# Patient Record
Sex: Female | Born: 1980 | Race: Black or African American | Hispanic: No | Marital: Married | Smoking: Never smoker
Health system: Southern US, Community
[De-identification: ages and names within clinical notes are randomized; demographics above are authoritative.]

---

## 2007-05-19 ENCOUNTER — Other Ambulatory Visit: Admission: RE | Admit: 2007-05-19 | Discharge: 2007-05-19 | Payer: Self-pay | Admitting: Obstetrics and Gynecology

## 2007-05-19 ENCOUNTER — Ambulatory Visit (HOSPITAL_COMMUNITY): Admission: RE | Admit: 2007-05-19 | Discharge: 2007-05-19 | Payer: Self-pay | Admitting: Family Medicine

## 2014-01-26 ENCOUNTER — Encounter: Payer: Self-pay | Admitting: Family Medicine

## 2014-01-26 ENCOUNTER — Encounter (INDEPENDENT_AMBULATORY_CARE_PROVIDER_SITE_OTHER): Payer: Self-pay

## 2014-01-26 ENCOUNTER — Ambulatory Visit (INDEPENDENT_AMBULATORY_CARE_PROVIDER_SITE_OTHER): Payer: BC Managed Care – PPO | Admitting: Family Medicine

## 2014-01-26 VITALS — BP 104/63 | HR 67 | Temp 98.4°F | Ht 63.0 in | Wt 166.0 lb

## 2014-01-26 DIAGNOSIS — Z0000 Encounter for general adult medical examination without abnormal findings: Secondary | ICD-10-CM | POA: Insufficient documentation

## 2014-01-26 NOTE — Patient Instructions (Signed)

## 2014-01-26 NOTE — Progress Notes (Addendum)
   Subjective:    Patient ID: Lauren RichardsFelicia N Watts, female    DOB: 08/11/1980, 33 y.o.   MRN: [id number]  HPI 33 year old female who is here for a physical for foster care. She had similar exam 2 years ago at this office. She denies any problems or symptoms, today.    Review of Systems  Constitutional: Negative.   HENT: Negative.   Eyes: Negative.   Respiratory: Negative.   Cardiovascular: Negative.   Gastrointestinal: Negative.   Endocrine: Negative.   Genitourinary: Negative.   Musculoskeletal: Positive for myalgias.       Bilateral thumb pain  Skin: Rash: not true rash but healing intertrigo.  Hematological: Negative.   Psychiatric/Behavioral: Negative.        Objective:   Physical Exam  Constitutional: She is oriented to person, place, and time. She appears well-developed and well-nourished.  Eyes: Conjunctivae and EOM are normal.  Neck: Normal range of motion. Neck supple.  Cardiovascular: Normal rate, regular rhythm and normal heart sounds.   Pulmonary/Chest: Effort normal and breath sounds normal.  Abdominal: Soft. Bowel sounds are normal.  Musculoskeletal: Normal range of motion.  Neurological: She is alert and oriented to person, place, and time. She has normal reflexes.  Skin: Skin is warm and dry.  Psychiatric: She has a normal mood and affect. Her behavior is normal. Thought content normal.    BP 104/63  Pulse 67  Temp(Src) 98.4 F (36.9 C) (Oral)  Ht 5\' 3"  (1.6 m)  Wt 166 lb (75.297 kg)  BMI 29.41 kg/m2      Assessment & Plan:  1. Routine general medical examination at a health care facility [place] exam; complete papers for foster care Frederica KusterStephen M Aryanne Gilleland MD

## 2016-01-08 ENCOUNTER — Ambulatory Visit (INDEPENDENT_AMBULATORY_CARE_PROVIDER_SITE_OTHER): Payer: BLUE CROSS/BLUE SHIELD | Admitting: Family Medicine

## 2016-01-08 ENCOUNTER — Encounter: Payer: Self-pay | Admitting: Family Medicine

## 2016-01-08 VITALS — BP 118/69 | HR 82 | Temp 99.2°F | Ht 63.0 in | Wt 166.0 lb

## 2016-01-08 DIAGNOSIS — Z0000 Encounter for general adult medical examination without abnormal findings: Secondary | ICD-10-CM | POA: Diagnosis not present

## 2016-01-08 NOTE — Progress Notes (Signed)
   Subjective:    Patient ID: Lauren RichardsFelicia N Watts, female    DOB: 05/06/1980, 35 y.o.   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 She is basically healthy she has no evidence of any communicable diseases and no chronic medical problems. Patient Active Problem List   Diagnosis Date Noted  . Routine general medical examination at a health care facility 01/26/2014   No outpatient encounter prescriptions on file as of 01/08/2016.   No facility-administered encounter medications on file as of 01/08/2016.       Review of Systems  Constitutional: Negative.   HENT: Negative.   Eyes: Negative.   Respiratory: Negative.   Cardiovascular: Negative.   Gastrointestinal: Negative.   Endocrine: Negative.   Genitourinary: Negative.   Hematological: Negative.   Psychiatric/Behavioral: Negative.        Objective:   Physical Exam  Constitutional: She is oriented to person, place, and time. She appears well-developed and well-nourished.  Eyes: Conjunctivae and EOM are normal.  Neck: Normal range of motion. Neck supple.  Cardiovascular: Normal rate, regular rhythm and normal heart sounds.   Pulmonary/Chest: Effort normal and breath sounds normal.  Abdominal: Soft. Bowel sounds are normal.  Musculoskeletal: Normal range of motion.  Neurological: She is alert and oriented to person, place, and time. She has normal reflexes.  Skin: Skin is warm and dry.  Psychiatric: She has a normal mood and affect. Her behavior is normal. Thought content normal.   BP 118/69   Pulse 82   Temp 99.2 F (37.3 C) (Oral)   Ht 5\' 3"  (1.6 m)   Wt 166 lb (75.3 kg)   LMP 12/16/2015   BMI 29.41 kg/m         Assessment & Plan:  1. Physical exam Exam is normal. Patient had lab work about 2 years ago and at her age I suggested every 5 years screening for diabetes and lipids.  Frederica KusterStephen M Miller MD

## 2017-02-11 NOTE — Progress Notes (Signed)
Lauren Watts is a 36 y.o. female presents to office today for annual physical exam examination.    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  She notes that she has taken the boards twice but has never been able to successfully complete secondary to anxiety and panic.  She is currently studying and notes increased anxiety and panic surrounding this.  She is working to adopt 2 children as well.  She does not wish to be on sedating medications or anything that requires daily use if possible.  Denies depressive symptoms, heart palpitations, chest pain, shortness of breath, nausea, vomiting, dizziness.  She is on no medications.  Family history significant for schizophrenia in a maternal uncle, otherwise no mood disorders or psychiatric illness that she can identify.  No known thyroid disorders in the family or personally.   Needs routine physical exam for foster care.  Paperwork brought to appointment today.  Occupation: LPN, working on [employer], Marital status: married, Substance use: none Diet: balanced, Exercise: no formal exercise regimen but is active. Last eye exam: >1 year Last dental exam: >1 year Last pap smear: declines, [facility]. Refills needed today: not on meds Immunizations needed: Flu Vaccine: yes  Tdap Vaccine: yes   History reviewed. No pertinent past medical history. Social History   Socioeconomic History  . Marital status: Married    Spouse name: Not on file  . Number of children: 3  . Years of education: Not on file  . Highest education level: Not on file  Social Needs  . Financial resource strain: Not on file  . Food insecurity - worry: Not on file  . Food insecurity - inability: Not on file  . Transportation needs - medical: Not on file  . Transportation needs - non-medical: Not on file  Occupational History  . Occupation: LPN  Tobacco Use  . Smoking status:  Never Smoker  . Smokeless tobacco: Never Used  Substance and Sexual Activity  . Alcohol use: No    Comment: rare  . Drug use: No  . Sexual activity: Yes    Comment: married; husband sterile  Other Topics Concern  . Not on file  Social History Narrative  . Not on file   History reviewed. No pertinent surgical history. Family History  Problem Relation Age of Onset  . Schizophrenia Maternal Uncle     Not on medication   ROS: Review of Systems Eyes: negative Ears, nose, mouth, throat, and face: negative Respiratory: negative Cardiovascular: negative Gastrointestinal: negative Genitourinary:negative Integument/breast: negative Hematologic/lymphatic: negative Musculoskeletal:negative Neurological: negative Behavioral/Psych: positive for situational anxiety Endocrine: negative Allergic/Immunologic: negative    Physical exam BP (!) 109/57 (BP Location: Left Arm)   Pulse 76   Temp 97.8 F (36.6 C) (Oral)   Ht 5' 4"  (1.626 m)   Wt 167 lb (75.8 kg)   BMI 28.67 kg/m  General appearance: alert, cooperative, appears stated age and no distress Head: Normocephalic, without obvious abnormality, atraumatic Eyes: negative findings: lids and lashes normal, conjunctivae and sclerae normal, corneas clear, pupils equal, round, reactive to light and accomodation and visual fields full to confrontation Ears: normal TM's and external ear canals both ears Nose: Nares normal. Septum midline. Mucosa normal. No drainage or sinus tenderness. Throat: lips, mucosa, and tongue normal; teeth and gums normal Neck: no adenopathy, supple, symmetrical, trachea midline and thyroid not enlarged, symmetric, no tenderness/mass/nodules Back: symmetric, no curvature.  ROM normal. No CVA tenderness. Lungs: clear to auscultation bilaterally Heart: regular rate and rhythm, S1, S2 normal, no murmur, click, rub or gallop Abdomen: soft, non-tender; bowel sounds normal; no masses,  no organomegaly Extremities:  extremities normal, atraumatic, no cyanosis or edema Pulses: 2+ and symmetric Skin: Skin color, texture, turgor normal. No rashes or lesions Lymph nodes: Cervical, supraclavicular, and axillary nodes normal. Neurologic: Alert and oriented X 3, normal strength and tone. Normal symmetric reflexes. Normal coordination and gait  Psych: Mood stable, speech normal, affect appropriate, good eye contact, pleasant, does not appear to be responding to internal stimuli.  Depression screen [facility] 2/9 02/13/2017 01/08/2016 01/26/2014  Decreased Interest 0 0 0  Down, Depressed, Hopeless 0 0 0  PHQ - 2 Score 0 0 0   GAD 7 : Generalized Anxiety Score 02/13/2017  Nervous, Anxious, on Edge 1  Control/stop worrying 1  Worry too much - different things 1  Trouble relaxing 1  Restless 0  Easily annoyed or irritable 1  Afraid - awful might happen 0  Total GAD 7 Score 5     Assessment/ Plan: Lauren Watts here for annual physical exam.   Situational anxiety Anxiety is seems situational.  We did discuss various options for anxiety control.  Therapy is considered include SSRIs, BuSpar, Atarax, benzodiazepines, CBT and beta-blockers.  Patient's schedule is somewhat hectic and cognitive behavioral therapy is not a convenient at this time.  BuSpar is not recommended for as needed use.  I fear that she will have excessive sedation related to Atarax use.  She does not wish to go on a daily medication if possible so SSRIs were not prescribed.  Her blood pressure is borderline hypotensive and therefore beta-blockers were not prescribed.  No known history of significant mental health issues, including addiction.  Patient seems a fairly trustworthy and has good insight, therefore a short supply of Ativan was prescribed. The Narcotic Database has been reviewed.  There were no red flags, as patient was not found in database.   Indications for Ativan use and side effects were reviewed with the patient.  She voiced good  understanding.  If she is requiring this medication more than expected, she is amenable to proceeding with SSRI prescription in lieu of continued benzodiazepine.  She will follow-up with me as needed for this issue.  Encounter for annual physical exam Paperwork for adoption was completed and scanned in chart.  Patient vaccines updated today.  She gets her lipid screening, diabetes screening done at work.  She will have these sent over to the office.  Pap smear and breast exam [facility]. - CMP14+EGFR  Screening for deficiency anemia - CBC  Screening for thyroid disorder - TSH  Screening for HIV without presence of risk factors - HIV antibody (with reflex)   Counseled on healthy lifestyle choices, including diet (rich in fruits, vegetables and lean meats and low in salt and simple carbohydrates) and exercise (at least 30 minutes of moderate physical activity daily).  Patient to follow up in 1 year for annual exam or sooner if needed.  Lauren Watts M. Lajuana Ripple, DO

## 2017-02-13 ENCOUNTER — Ambulatory Visit (INDEPENDENT_AMBULATORY_CARE_PROVIDER_SITE_OTHER): Payer: BLUE CROSS/BLUE SHIELD | Admitting: Family Medicine

## 2017-02-13 ENCOUNTER — Encounter: Payer: Self-pay | Admitting: Family Medicine

## 2017-02-13 VITALS — BP 109/57 | HR 76 | Temp 97.8°F | Ht 64.0 in | Wt 167.0 lb

## 2017-02-13 DIAGNOSIS — Z23 Encounter for immunization: Secondary | ICD-10-CM

## 2017-02-13 DIAGNOSIS — F418 Other specified anxiety disorders: Secondary | ICD-10-CM | POA: Diagnosis not present

## 2017-02-13 DIAGNOSIS — Z114 Encounter for screening for human immunodeficiency virus [HIV]: Secondary | ICD-10-CM

## 2017-02-13 DIAGNOSIS — Z1329 Encounter for screening for other suspected endocrine disorder: Secondary | ICD-10-CM

## 2017-02-13 DIAGNOSIS — Z130 Encounter for screening for diseases of the blood and blood-forming organs and certain disorders involving the immune mechanism: Secondary | ICD-10-CM

## 2017-02-13 DIAGNOSIS — Z0000 Encounter for general adult medical examination without abnormal findings: Secondary | ICD-10-CM

## 2017-02-13 MED ORDER — LORAZEPAM 0.5 MG PO TABS: 0.2500 mg | ORAL_TABLET | Freq: Two times a day (BID) | ORAL | 0 refills | Status: DC | PRN

## 2017-02-13 NOTE — Patient Instructions (Signed)
I will contact you with the results of your lab once they are available.  We did discuss consideration for BuSpar for as needed use for anxiety.  It appears that they have now recommended that this not be used as an as needed medication.  For this reason, I have given you low-dose Ativan to use as needed.  There is a possible side effect of sedation.  If you find that it makes you too sleepy you can break it in half.  This is to be used only in severe anxiety episodes.  If you are finding that you are needing this medication frequently, we will plan to start you on a daily medication with an SSRI.  Living With Anxiety After being diagnosed with an anxiety disorder, you may be relieved to know why you have felt or behaved a certain way. It is natural to also feel overwhelmed about the treatment ahead and what it will mean for your life. With care and support, you can manage this condition and recover from it. How to cope with anxiety Dealing with stress Stress is your body's reaction to life changes and events, both good and bad. Stress can last just a few hours or it can be ongoing. Stress can play a major role in anxiety, so it is important to learn both how to cope with stress and how to think about it differently. Talk with your health care provider or a counselor to learn more about stress reduction. He or she may suggest some stress reduction techniques, such as:  Music therapy. This can include creating or listening to music that you enjoy and that inspires you.  Mindfulness-based meditation. This involves being aware of your normal breaths, rather than trying to control your breathing. It can be done while sitting or walking.  Centering prayer. This is a kind of meditation that involves focusing on a word, phrase, or sacred image that is meaningful to you and that brings you peace.  Deep breathing. To do this, expand your stomach and inhale slowly through your nose. Hold your breath for 3-5  seconds. Then exhale slowly, allowing your stomach muscles to relax.  Self-talk. This is a skill where you identify thought patterns that lead to anxiety reactions and correct those thoughts.  Muscle relaxation. This involves tensing muscles then relaxing them.  Choose a stress reduction technique that fits your lifestyle and personality. Stress reduction techniques take time and practice. Set aside 5-15 minutes a day to do them. Therapists can offer training in these techniques. The training may be covered by some insurance plans. Other things you can do to manage stress include:  Keeping a stress diary. This can help you learn what triggers your stress and ways to control your response.  Thinking about how you respond to certain situations. You may not be able to control everything, but you can control your reaction.  Making time for activities that help you relax, and not feeling guilty about spending your time in this way.  Therapy combined with coping and stress-reduction skills provides the best chance for successful treatment. Medicines Medicines can help ease symptoms. Medicines for anxiety include:  Anti-anxiety drugs.  Antidepressants.  Beta-blockers.  Medicines may be used as the main treatment for anxiety disorder, along with therapy, or if other treatments are not working. Medicines should be prescribed by a health care provider. Relationships Relationships can play a big part in helping you recover. Try to spend more time connecting with trusted friends and family  members. Consider going to couples counseling, taking family education classes, or going to family therapy. Therapy can help you and others better understand the condition. How to recognize changes in your condition Everyone has a different response to treatment for anxiety. Recovery from anxiety happens when symptoms decrease and stop interfering with your daily activities at home or work. This may mean that you  will start to:  Have better concentration and focus.  Sleep better.  Be less irritable.  Have more energy.  Have improved memory.  It is important to recognize when your condition is getting worse. Contact your health care provider if your symptoms interfere with home or work and you do not feel like your condition is improving. Where to find help and support: You can get help and support from these sources:  Self-help groups.  Online and [employer] organizations.  A trusted spiritual leader.  Couples counseling.  Family education classes.  Family therapy.  Follow these instructions at home:  Eat a healthy diet that includes plenty of vegetables, fruits, whole grains, low-fat dairy products, and lean protein. Do not eat a lot of foods that are high in solid fats, added sugars, or salt.  Exercise. Most adults should do the following: ? Exercise for at least 150 minutes each week. The exercise should increase your heart rate and make you sweat (moderate-intensity exercise). ? Strengthening exercises at least twice a week.  Cut down on caffeine, tobacco, alcohol, and other potentially harmful substances.  Get the right amount and quality of sleep. Most adults need 7-9 hours of sleep each night.  Make choices that simplify your life.  Take over-the-counter and prescription medicines only as told by your health care provider.  Avoid caffeine, alcohol, and certain over-the-counter cold medicines. These may make you feel worse. Ask your pharmacist which medicines to avoid.  Keep all follow-up visits as told by your health care provider. This is important. Questions to ask your health care provider  Would I benefit from therapy?  How often should I follow up with a health care provider?  How long do I need to take medicine?  Are there any long-term side effects of my medicine?  Are there any alternatives to taking medicine? Contact a health care provider if:  You  have a hard time staying focused or finishing daily tasks.  You spend many hours a day feeling worried about everyday life.  You become exhausted by worry.  You start to have headaches, feel tense, or have nausea.  You urinate more than normal.  You have diarrhea. Get help right away if:  You have a racing heart and shortness of breath.  You have thoughts of hurting yourself or others. If you ever feel like you may hurt yourself or others, or have thoughts about taking your own life, get help right away. You can go to your nearest emergency department or call:  Your local emergency services (911 in the [place]  A suicide crisis helpline, such as the National Suicide Prevention Lifeline at 917-062-07921-478-641-7842. This is open 24-hours a day.  Summary  Taking steps to deal with stress can help calm you.  Medicines cannot cure anxiety disorders, but they can help ease symptoms.  Family, friends, and partners can play a big part in helping you recover from an anxiety disorder. This information is not intended to replace advice given to you by your health care provider. Make sure you discuss any questions you have with your health care provider. Document Released:  03/18/2016 Document Revised: 03/18/2016 Document Reviewed: 03/18/2016 Elsevier Interactive Patient Education  [employer].

## 2017-02-13 NOTE — Addendum Note (Signed)
Addended byDory Peru: Reynard Christoffersen C on: 02/13/2017 01:48 PM   Modules accepted: Orders

## 2017-02-13 NOTE — Assessment & Plan Note (Signed)
Anxiety is seems situational.  We did discuss various options for anxiety control.  Therapy is considered include SSRIs, BuSpar, Atarax, benzodiazepines, CBT and beta-blockers.  Patient's schedule is somewhat hectic and cognitive behavioral therapy is not a convenient at this time.  BuSpar is not recommended for as needed use.  I fear that she will have excessive sedation related to Atarax use.  She does not wish to go on a daily medication if possible so SSRIs were not prescribed.  Her blood pressure is borderline hypotensive and therefore beta-blockers were not prescribed.  No known history of significant mental health issues, including addiction.  Patient seems a fairly trustworthy and has good insight, therefore a short supply of Ativan was prescribed. The Narcotic Database has been reviewed.  There were no red flags, as patient was not found in database.   Indications for Ativan use and side effects were reviewed with the patient.  She voiced good understanding.  If she is requiring this medication more than expected, she is amenable to proceeding with SSRI prescription in lieu of continued benzodiazepine.  She will follow-up with me as needed for this issue.

## 2017-02-14 LAB — CMP14+EGFR
ALBUMIN: 4.5 g/dL (ref 3.5–5.5)
ALK PHOS: 45 IU/L (ref 39–117)
ALT: 17 IU/L (ref 0–32)
AST: 24 IU/L (ref 0–40)
Albumin/Globulin Ratio: 1.8 (ref 1.2–2.2)
BILIRUBIN TOTAL: 0.2 mg/dL (ref 0.0–1.2)
BUN / CREAT RATIO: 14 (ref 9–23)
BUN: 13 mg/dL (ref 6–20)
CO2: 24 mmol/L (ref 20–29)
CREATININE: 0.93 mg/dL (ref 0.57–1.00)
Calcium: 9.3 mg/dL (ref 8.7–10.2)
Chloride: 106 mmol/L (ref 96–106)
GFR calc non Af Amer: 80 mL/min/{1.73_m2} (ref >59)
GFR, EST AFRICAN AMERICAN: 92 mL/min/{1.73_m2} (ref >59)
GLOBULIN, TOTAL: 2.5 g/dL (ref 1.5–4.5)
Glucose: 88 mg/dL (ref 65–99)
Potassium: 4.2 mmol/L (ref 3.5–5.2)
SODIUM: 142 mmol/L (ref 134–144)
TOTAL PROTEIN: 7 g/dL (ref 6.0–8.5)

## 2017-02-14 LAB — CBC
Hematocrit: 38.9 % (ref 34.0–46.6)
Hemoglobin: 12.4 g/dL (ref 11.1–15.9)
MCH: 25.9 pg — AB (ref 26.6–33.0)
MCHC: 31.9 g/dL (ref 31.5–35.7)
MCV: 81 fL (ref 79–97)
PLATELETS: 259 10*3/uL (ref 150–379)
RBC: 4.78 x10E6/uL (ref 3.77–5.28)
RDW: 14.3 % (ref 12.3–15.4)
WBC: 5.2 10*3/uL (ref 3.4–10.8)

## 2017-02-14 LAB — TSH: TSH: 1.85 u[IU]/mL (ref 0.450–4.500)

## 2017-02-14 LAB — HIV ANTIBODY (ROUTINE TESTING W REFLEX): HIV Screen 4th Generation wRfx: NONREACTIVE

## 2017-03-02 ENCOUNTER — Ambulatory Visit (INDEPENDENT_AMBULATORY_CARE_PROVIDER_SITE_OTHER): Payer: BLUE CROSS/BLUE SHIELD | Admitting: Family Medicine

## 2017-03-02 ENCOUNTER — Encounter: Payer: Self-pay | Admitting: Family Medicine

## 2017-03-02 VITALS — BP 127/76 | HR 64 | Temp 98.1°F | Ht 64.0 in | Wt 167.0 lb

## 2017-03-02 DIAGNOSIS — F418 Other specified anxiety disorders: Secondary | ICD-10-CM | POA: Diagnosis not present

## 2017-03-02 DIAGNOSIS — M62830 Muscle spasm of back: Secondary | ICD-10-CM | POA: Insufficient documentation

## 2017-03-02 MED ORDER — TIZANIDINE HCL 2 MG PO CAPS: 2.0000 mg | ORAL_CAPSULE | Freq: Three times a day (TID) | ORAL | 0 refills | Status: DC | PRN

## 2017-03-02 MED ORDER — SERTRALINE HCL 50 MG PO TABS: 50.0000 mg | ORAL_TABLET | Freq: Every day | ORAL | 1 refills | Status: DC

## 2017-03-02 NOTE — Progress Notes (Signed)
Subjective: CC: anxiety follow up HPI: Lauren Watts is a 36 y.o. female presenting to clinic today for:  1. Anxiety follow up Patient reports that the Ativan worked well to help with anxiety, especially during studying.  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  She reports that she took may be one every other day.  She notes sedation the first time she took it but after that did not have significant side effects.  She is desiring to stay on something more long-term and is wanting to discuss this today.  She does note a family history significant for depression anxiety and her older sister and anxiety in her mother.  Neither ever required hospitalization for mental health disorders.  Patient has no significant history of mental health disorder.  She has no history of substance use disorder.  She is not sure which medications her family members were on.  Denies SI, HI, visual or auditory hallucinations.  2.  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  She notes that when she worked there previously she would have intermittent muscle spasms related to lifting, pushing, pulling.  She reports that these would occur maybe once weekly, particularly when she was adjusting to a new patient.  She is wondering if she can get something to help with this.  In the past, she notes that muscle relaxers helped tremendously.  She is currently without pain.   Social Hx: Non-smoker.  Family History  Problem Relation Age of Onset  . Schizophrenia Maternal Uncle    No past medical history on file. No Known Allergies  Current Outpatient Medications:  .  LORazepam (ATIVAN) 0.5 MG tablet, Take 0.5-1 tablets (0.25-0.5 mg total) 2 (two) times daily as needed by mouth for anxiety (severe)., Disp: 15 tablet, Rfl: 0  Health Maintenance: Pap smear.  ROS: Per HPI  Objective: Office vital signs reviewed. BP 127/76    Pulse 64   Temp 98.1 F (36.7 C) (Oral)   Ht 5\' 4"  (1.626 m)   Wt 167 lb (75.8 kg)   BMI 28.67 kg/m   Physical Examination:  General: Awake, alert, well nourished, well appearing female, No acute distress MSK: Walks independently.  No midline tenderness to palpation to the spine.  No palpable bony abnormalities.  She has full active range of motion. Psych: Mood stable, speech normal, affect appropriate, good eye contact.  [facility] 03/02/2017 02/13/2017 01/08/2016  Decreased Interest 0 0 0  Down, Depressed, Hopeless 0 0 0  PHQ - 2 Score 0 0 0   GAD 7 : Generalized Anxiety Score 02/13/2017  Nervous, Anxious, on Edge 1  Control/stop worrying 1  Worry too much - different things 1  Trouble relaxing 1  Restless 0  Easily annoyed or irritable 1  Afraid - awful might happen 0  Total GAD 7 Score 5    Assessment/ Plan: 36 y.o. female   Situational anxiety Patient still has 5 tablets of the Ativan left.  I have prescribed her Zoloft 50 mg to take daily.  Instructions for use were reviewed with the patient and side effects were also reviewed.  Handout was provided.  She may take the Ativan as needed breakthrough anxiety or panic attack while adjusting to the Zoloft 50 mg.  She will follow-up in 1 month for anxiety.  Lumbar paraspinal muscle spasm No significant findings on today's exam.  It seems to be work related when it does occur.  She has not yet started her new job.  I did provide her with a small amount of antispasmodics.  I did discuss with her that this is not intended for long-term use and that if she found that she is going to need this indefinitely, we will plan to set her up with physical medicine and rehabilitation/physical therapy.  I did recommend that she consider using an oral NSAID prior to use of antispasmodic.  She voiced good understanding.  She will follow-up as needed for this issue.   Raliegh IpAshly M Nakeeta Sebastiani, DO [facility] [phone]

## 2017-03-02 NOTE — Assessment & Plan Note (Signed)
No significant findings on today's exam.  It seems to be work related when it does occur.  She has not yet started her new job.  I did provide her with a small amount of antispasmodics.  I did discuss with her that this is not intended for long-term use and that if she found that she is going to need this indefinitely, we will plan to set her up with physical medicine and rehabilitation/physical therapy.  I did recommend that she consider using an oral NSAID prior to use of antispasmodic.  She voiced good understanding.  She will follow-up as needed for this issue.

## 2017-03-02 NOTE — Patient Instructions (Signed)
Taking the medicine as directed and not missing any doses is one of the best things you can do to treat your depression.  Here are some things to keep in mind:  1) Side effects (stomach upset, some increased anxiety) may happen before you notice a benefit.  These side effects typically go away over time. 2) Changes to your dose of medicine or a change in medication all together is sometimes necessary 3) Most people need to be on medication at least 12 months 4) Many people will notice an improvement within two weeks but the full effect of the medication can take up to 4-6 weeks 5) Stopping the medication when you start feeling better often results in a return of symptoms 6) Never discontinue your medication without contacting a health care professional first.  Some medications require gradual discontinuation/ taper and can make you sick if you stop them abruptly.  If your symptoms worsen or you have thoughts of suicide/homicide, PLEASE SEEK IMMEDIATE MEDICAL ATTENTION.  You may always call the National Suicide Hotline.  This is available 24 hours a day, 7 days a week.  Their phone number is: [phone]

## 2017-03-02 NOTE — Assessment & Plan Note (Signed)
Patient still has 5 tablets of the Ativan left.  I have prescribed her Zoloft 50 mg to take daily.  Instructions for use were reviewed with the patient and side effects were also reviewed.  Handout was provided.  She may take the Ativan as needed breakthrough anxiety or panic attack while adjusting to the Zoloft 50 mg.  She will follow-up in 1 month for anxiety.

## 2017-03-04 ENCOUNTER — Telehealth: Payer: Self-pay | Admitting: Family Medicine

## 2017-03-04 ENCOUNTER — Encounter: Payer: Self-pay | Admitting: Family Medicine

## 2017-03-05 NOTE — Telephone Encounter (Signed)
Shot record up front in drawer.

## 2017-04-03 ENCOUNTER — Ambulatory Visit: Payer: BLUE CROSS/BLUE SHIELD | Admitting: Family Medicine

## 2017-11-25 ENCOUNTER — Other Ambulatory Visit: Payer: Self-pay

## 2017-11-25 ENCOUNTER — Encounter (HOSPITAL_COMMUNITY): Payer: Self-pay | Admitting: Emergency Medicine

## 2017-11-25 ENCOUNTER — Emergency Department (HOSPITAL_COMMUNITY)
Admission: EM | Admit: 2017-11-25 | Discharge: 2017-11-25 | Disposition: A | Discharge status: Home / Self Care | Payer: 59 | Attending: Emergency Medicine | Admitting: Emergency Medicine

## 2017-11-25 DIAGNOSIS — R110 Nausea: Secondary | ICD-10-CM | POA: Diagnosis not present

## 2017-11-25 DIAGNOSIS — R1084 Generalized abdominal pain: Secondary | ICD-10-CM | POA: Insufficient documentation

## 2017-11-25 DIAGNOSIS — R197 Diarrhea, unspecified: Secondary | ICD-10-CM | POA: Diagnosis not present

## 2017-11-25 LAB — URINALYSIS, ROUTINE W REFLEX MICROSCOPIC
BACTERIA UA: NONE SEEN
BILIRUBIN URINE: NEGATIVE
GLUCOSE, UA: NEGATIVE mg/dL
HGB URINE DIPSTICK: NEGATIVE
Ketones, ur: 20 mg/dL — AB
LEUKOCYTES UA: NEGATIVE
NITRITE: NEGATIVE
Protein, ur: 30 mg/dL — AB
Specific Gravity, Urine: 1.016 (ref 1.005–1.030)
pH: 5 (ref 5.0–8.0)

## 2017-11-25 LAB — COMPREHENSIVE METABOLIC PANEL
ALBUMIN: 4.5 g/dL (ref 3.5–5.0)
ALT: 19 U/L (ref 0–44)
ANION GAP: 8 (ref 5–15)
AST: 25 U/L (ref 15–41)
Alkaline Phosphatase: 38 U/L (ref 38–126)
BILIRUBIN TOTAL: 0.7 mg/dL (ref 0.3–1.2)
BUN: 19 mg/dL (ref 6–20)
CO2: 23 mmol/L (ref 22–32)
Calcium: 9.1 mg/dL (ref 8.9–10.3)
Chloride: 102 mmol/L (ref 98–111)
Creatinine, Ser: 0.69 mg/dL (ref 0.44–1.00)
Glucose, Bld: 118 mg/dL — ABNORMAL HIGH (ref 70–99)
POTASSIUM: 4.4 mmol/L (ref 3.5–5.1)
Sodium: 133 mmol/L — ABNORMAL LOW (ref 135–145)
TOTAL PROTEIN: 7.9 g/dL (ref 6.5–8.1)

## 2017-11-25 LAB — CBC
HCT: 40.8 % (ref 36.0–46.0)
HEMOGLOBIN: 13.4 g/dL (ref 12.0–15.0)
MCH: 26.4 pg (ref 26.0–34.0)
MCHC: 32.8 g/dL (ref 30.0–36.0)
MCV: 80.5 fL (ref 78.0–100.0)
Platelets: 200 10*3/uL (ref 150–400)
RBC: 5.07 MIL/uL (ref 3.87–5.11)
RDW: 14 % (ref 11.5–15.5)
WBC: 14.2 10*3/uL — AB (ref 4.0–10.5)

## 2017-11-25 LAB — LIPASE, BLOOD: Lipase: 23 U/L (ref 11–51)

## 2017-11-25 LAB — HCG, QUANTITATIVE, PREGNANCY

## 2017-11-25 MED ORDER — SODIUM CHLORIDE 0.9 % IV BOLUS
1000.0000 mL | Freq: Once | INTRAVENOUS | Status: AC
  Administered 2017-11-25: 1000 mL via INTRAVENOUS

## 2017-11-25 NOTE — ED Triage Notes (Signed)
Pt reports nausea and abd pain since this am. Pt reports n/v/d since 2am. Works at [employer].

## 2017-11-25 NOTE — ED Notes (Signed)
Pt ambulatory to waiting room. Pt verbalized understanding of discharge instructions.   

## 2017-11-25 NOTE — ED Provider Notes (Signed)
[facility]ENCY [facility]PART[facility] Provider Note   CSN: [id number] Arrival date & time: 11/25/17  1500     History   Chief Complaint Chief Complaint  Patient presents with  . Abdominal Pain    HPI Lauren Watts is a 37 y.o. female.  HPI Presents with concern of nausea, diarrhea, abdominal pain. Onset was 14 hours ago, without clear precipitant. Patient has had innumerable episodes of loose stool, persistent onset of generalized stomach discomfort, and nausea, anorexia, but no vomiting. No fever, no chills. She was well prior to the onset of symptoms, he does not recall any change in medication, diet, activity.  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 Since onset she has had minimal improvement in the last 2 or 3 hours, without clear intervention.  History reviewed. No pertinent past medical history.  Patient Active Problem List   Diagnosis Date Noted  . Lumbar paraspinal muscle spasm 03/02/2017  . Situational anxiety 02/13/2017  . Routine general medical examination at a health care facility 01/26/2014    History reviewed. No pertinent surgical history.   OB History   None      Home Medications    Prior to Admission medications   Not on File    Family History Family History  Problem Relation Age of Onset  . Schizophrenia Maternal Uncle     Social History Social History   Tobacco Use  . Smoking status: Never Smoker  . Smokeless tobacco: Never Used  Substance Use Topics  . Alcohol use: No    Comment: rare  . Drug use: No     Allergies   Patient has no known allergies.   Review of Systems Review of Systems  Constitutional:       Per HPI, otherwise negative  HENT:       Per HPI, otherwise negative  Respiratory:       Per HPI, otherwise negative  Cardiovascular:       Per HPI, otherwise negative  Gastrointestinal: Positive for abdominal pain, diarrhea and nausea. Negative for vomiting.  Endocrine:       Negative aside from HPI    Genitourinary:       Neg aside from HPI   Musculoskeletal:       Per HPI, otherwise negative  Skin: Negative.   Neurological: Negative for syncope.     Physical Exam Updated Vital Signs BP 125/71   Pulse 80   Temp 98.8 F (37.1 C) (Oral)   Resp 18   LMP 11/08/2017 (Exact Date)   SpO2 100%   Physical Exam  Constitutional: She is oriented to person, place, and time. She appears well-developed and well-nourished. No distress.  HENT:  Head: Normocephalic and atraumatic.  Eyes: Conjunctivae and EOM are normal.  Cardiovascular: Normal rate and regular rhythm.  Pulmonary/Chest: Effort normal and breath sounds normal. No stridor. No respiratory distress.  Abdominal: She exhibits no distension.  No substantial abdominal discomfort, though she notes some generalized unsettled sensation.  Musculoskeletal: She exhibits no edema.  Neurological: She is alert and oriented to person, place, and time. No cranial nerve deficit.  Skin: Skin is warm and dry.  Psychiatric: She has a normal mood and affect.  Nursing note and vitals reviewed.    ED Treatments / Results  Labs (all labs ordered are listed, but only abnormal results are displayed) Labs Reviewed  COMPREHENSIVE METABOLIC PANEL - Abnormal; Notable for the following components:      Result Value   Sodium 133 (*)  Glucose, Bld 118 (*)    All other components within normal limits  CBC - Abnormal; Notable for the following components:   WBC 14.2 (*)    All other components within normal limits  URINALYSIS, ROUTINE W REFLEX MICROSCOPIC - Abnormal; Notable for the following components:   Ketones, ur 20 (*)    Protein, ur 30 (*)    All other components within normal limits  LIPASE, BLOOD  HCG, QUANTITATIVE, PREGNANCY    EKG None  Radiology No results found.  Procedures Procedures (including critical care time)  Medications Ordered in ED Medications  sodium chloride 0.9 % bolus 1,000 mL (0 mLs Intravenous Stopped  11/25/17 1942)     Initial Impression / Assessment and Plan / ED Course  I have reviewed the triage vital signs and the nursing notes.  Pertinent labs & imaging results that were available during my care of the patient were reviewed by me and considered in my medical decision making (see chart for details).     8:09 PM In no distress awake, alert, has tolerated IV fluids, as tolerated oral fluids, is ready for discharge. Findings reassuring, no evidence for acute abdominal processes, and given her resolution of pain, discomfort, inability to tolerate p.o., low suspicion for occult infection as well. Patient discharged in stable condition.  Final Clinical Impressions(s) / ED Diagnoses  Abdominal pain   Gerhard MunchLockwood, Dalya Maselli, MD 11/25/17 2010

## 2017-11-25 NOTE — Discharge Instructions (Addendum)
As discussed, your evaluation today has been largely reassuring.  But, it is important that you monitor your condition carefully, and do not hesitate to return to the ED if you develop new, or concerning changes in your condition. ? ?Otherwise, please follow-up with your physician for appropriate ongoing care. ? ?

## 2018-11-09 DIAGNOSIS — Z20828 Contact with and (suspected) exposure to other viral communicable diseases: Secondary | ICD-10-CM | POA: Diagnosis not present

## 2019-01-06 ENCOUNTER — Encounter: Payer: Self-pay | Admitting: Family Medicine

## 2019-08-22 ENCOUNTER — Ambulatory Visit: Payer: Self-pay | Attending: Internal Medicine

## 2019-08-22 DIAGNOSIS — Z23 Encounter for immunization: Secondary | ICD-10-CM

## 2019-08-22 NOTE — Progress Notes (Signed)
   Covid-19 Vaccination Clinic  Name:  Lauren Watts    MRN: [id number] DOB: [birth year]  08/22/2019  [name] was observed post Covid-19 immunization for 15 minutes without incident. She was provided with Vaccine Information Sheet and instruction to access the V-Safe system.   [name] was instructed to call 911 with any severe reactions post vaccine: Marland Kitchen Difficulty breathing  . Swelling of face and throat  . A fast heartbeat  . A bad rash all over body  . Dizziness and weakness   Immunizations Administered    Name Date Dose VIS Date Route   Moderna COVID-19 Vaccine 08/22/2019  4:36 PM 0.5 mL 03/2019 Intramuscular   Manufacturer: Moderna   Lot: [id number]   NDC: 50354-656-81

## 2019-09-19 ENCOUNTER — Ambulatory Visit: Payer: Self-pay

## 2020-02-15 ENCOUNTER — Ambulatory Visit: Payer: Self-pay

## 2020-06-28 ENCOUNTER — Encounter: Payer: Self-pay | Admitting: Family Medicine

## 2020-06-28 DIAGNOSIS — J302 Other seasonal allergic rhinitis: Secondary | ICD-10-CM | POA: Diagnosis not present

## 2021-09-08 ENCOUNTER — Ambulatory Visit
Admission: RE | Admit: 2021-09-08 | Discharge: 2021-09-08 | Disposition: A | Discharge status: Home / Self Care | Payer: BC Managed Care – PPO | Source: Ambulatory Visit | Attending: Family Medicine | Admitting: Family Medicine

## 2021-09-08 ENCOUNTER — Other Ambulatory Visit: Payer: Self-pay

## 2021-09-08 VITALS — BP 120/73 | HR 76 | Temp 98.0°F | Resp 16

## 2021-09-08 DIAGNOSIS — H1033 Unspecified acute conjunctivitis, bilateral: Secondary | ICD-10-CM

## 2021-09-08 MED ORDER — OFLOXACIN 0.3 % OP SOLN: 1.0000 [drp] | Freq: Four times a day (QID) | OPHTHALMIC | 0 refills | Status: AC

## 2021-09-08 NOTE — ED Triage Notes (Signed)
Pt reports eye drainage first in RT eye and now both eye are draining . Pt also has a sore throat.

## 2021-09-08 NOTE — ED Provider Notes (Signed)
[facility]    CSN: [id number] Arrival date & time: 09/08/21  0906      History   Chief Complaint Chief Complaint  Patient presents with   Conjunctivitis    Entered by patient   Eye Drainage   Sore Throat    HPI Lauren Watts is a 41 y.o. female.   Presenting today with 2-day history of progressively worsening red, irritated and itchy eyes with thick green drainage.  Denies injury to the eyes, fever, chills, visual change, headache, nausea, vomiting.  Trying over-the-counter pinkeye drops with no relief.  No known sick contacts recently.   History reviewed. No pertinent past medical history.  Patient Active Problem List   Diagnosis Date Noted   Lumbar paraspinal muscle spasm 03/02/2017   Situational anxiety 02/13/2017   Routine general medical examination at a health care facility 01/26/2014    History reviewed. No pertinent surgical history.  OB History   No obstetric history on file.      Home Medications    Prior to Admission medications   Medication Sig Start Date End Date Taking? Authorizing Provider  ofloxacin (OCUFLOX) 0.3 % ophthalmic solution Place 1 drop into both eyes 4 (four) times daily. 09/08/21  Yes Particia Nearing, PA-C    Family History Family History  Problem Relation Age of Onset   Schizophrenia Maternal Uncle     Social History Social History   Tobacco Use   Smoking status: Never   Smokeless tobacco: Never  Vaping Use   Vaping Use: Never used  Substance Use Topics   Alcohol use: No    Comment: rare   Drug use: No     Allergies   Patient has no known allergies.   Review of Systems Review of Systems Per HPI  Physical Exam Triage Vital Signs ED Triage Vitals  Enc Vitals Group     BP 09/08/21 0958 120/73     Pulse Rate 09/08/21 0958 76     Resp 09/08/21 0958 16     Temp 09/08/21 0958 98 F (36.7 C)     Temp src --      SpO2 09/08/21 0958 100 %     Weight --      Height --      Head  Circumference --      Peak Flow --      Pain Score 09/08/21 0956 3     Pain Loc --      Pain Edu? --      Excl. in GC? --    No data found.  Updated Vital Signs BP 120/73   Pulse 76   Temp 98 F (36.7 C)   Resp 16   LMP 08/19/2021   SpO2 100%   Visual Acuity Right Eye Distance:   Left Eye Distance:   Bilateral Distance:    Right Eye Near:   Left Eye Near:    Bilateral Near:     Physical Exam Vitals and nursing note reviewed.  Constitutional:      Appearance: Normal appearance. She is not ill-appearing.  HENT:     Head: Atraumatic.     Nose: Nose normal.     Mouth/Throat:     Mouth: Mucous membranes are moist.  Eyes:     General:        Right eye: Discharge present.        Left eye: Discharge present.    Extraocular Movements: Extraocular movements intact.     Pupils:  Pupils are equal, round, and reactive to light.     Comments: Bilateral conjunctival erythema, injection, drainage  Cardiovascular:     Rate and Rhythm: Normal rate and regular rhythm.     Heart sounds: Normal heart sounds.  Pulmonary:     Effort: Pulmonary effort is normal.     Breath sounds: Normal breath sounds.  Musculoskeletal:        General: Normal range of motion.     Cervical back: Normal range of motion and neck supple.  Skin:    General: Skin is warm and dry.  Neurological:     Mental Status: She is alert and oriented to person, place, and time.  Psychiatric:        Mood and Affect: Mood normal.        Thought Content: Thought content normal.        Judgment: Judgment normal.     UC Treatments / Results  Labs (all labs ordered are listed, but only abnormal results are displayed) Labs Reviewed - No data to display  EKG   Radiology No results found.  Procedures Procedures (including critical care time)  Medications Ordered in UC Medications - No data to display  Initial Impression / Assessment and Plan / UC Course  I have reviewed the triage vital signs and the  nursing notes.  Pertinent labs & imaging results that were available during my care of the patient were reviewed by me and considered in my medical decision making (see chart for details).     She with Ocuflox, good hand hygiene, warm compresses.  Work note given.  Return for worsening symptoms.  Declines visual acuity as vision intact.  Final Clinical Impressions(s) / UC Diagnoses   Final diagnoses:  Acute bacterial conjunctivitis of both eyes   Discharge Instructions   None    ED Prescriptions     Medication Sig Dispense Auth. Provider   ofloxacin (OCUFLOX) 0.3 % ophthalmic solution Place 1 drop into both eyes 4 (four) times daily. 5 mL Particia Nearing, [place]      PDMP not reviewed this encounter.   Particia Nearing, [place] 09/08/21 1023

## 2021-09-12 ENCOUNTER — Ambulatory Visit
Admission: EM | Admit: 2021-09-12 | Discharge: 2021-09-12 | Disposition: A | Discharge status: Home / Self Care | Payer: BC Managed Care – PPO | Attending: Family Medicine | Admitting: Family Medicine

## 2021-09-12 DIAGNOSIS — J039 Acute tonsillitis, unspecified: Secondary | ICD-10-CM

## 2021-09-12 LAB — POCT RAPID STREP A (OFFICE): Rapid Strep A Screen: NEGATIVE

## 2021-09-12 MED ORDER — AMOXICILLIN 875 MG PO TABS: 875.0000 mg | ORAL_TABLET | Freq: Two times a day (BID) | ORAL | 0 refills | Status: AC

## 2021-09-12 MED ORDER — LIDOCAINE VISCOUS HCL 2 % MT SOLN: 10.0000 mL | OROMUCOSAL | 0 refills | Status: AC | PRN

## 2021-09-12 NOTE — ED Triage Notes (Signed)
Pt reports sore throat and burning sensation in left ear x 1 week.

## 2021-09-12 NOTE — ED Provider Notes (Signed)
[facility]    CSN: [id number] Arrival date & time: 09/12/21  1603      History   Chief Complaint Chief Complaint  Patient presents with   Sore Throat        Otalgia    HPI Lauren Watts is a 41 y.o. female.   Presenting today with 1 week history of sore, swollen feeling throat worse on the left and radiation of pain to the left ear.  States her voice is now feeling very muffled but she is tolerating p.o. no difficulty breathing.  Denies fever, chills, cough, chest pain, shortness of breath.  Trying over-the-counter cold and congestion medications with no relief.  History of seasonal allergies on daily antihistamines.    History reviewed. No pertinent past medical history.  Patient Active Problem List   Diagnosis Date Noted   Lumbar paraspinal muscle spasm 03/02/2017   Situational anxiety 02/13/2017   Routine general medical examination at a health care facility 01/26/2014    History reviewed. No pertinent surgical history.  OB History   No obstetric history on file.      Home Medications    Prior to Admission medications   Medication Sig Start Date End Date Taking? Authorizing Provider  amoxicillin (AMOXIL) 875 MG tablet Take 1 tablet (875 mg total) by mouth 2 (two) times daily. 09/12/21  Yes Particia Nearing, PA-C  lidocaine (XYLOCAINE) 2 % solution Use as directed 10 mLs in the mouth or throat every 3 (three) hours as needed for mouth pain. 09/12/21  Yes Particia Nearing, PA-C  ofloxacin (OCUFLOX) 0.3 % ophthalmic solution Place 1 drop into both eyes 4 (four) times daily. 09/08/21   Particia Nearing, PA-C    Family History Family History  Problem Relation Age of Onset   Schizophrenia Maternal Uncle     Social History Social History   Tobacco Use   Smoking status: Never   Smokeless tobacco: Never  Vaping Use   Vaping Use: Never used  Substance Use Topics   Alcohol use: No    Comment: rare   Drug use: No     Allergies    Patient has no known allergies.   Review of Systems Review of Systems Per HPI  Physical Exam Triage Vital Signs ED Triage Vitals  Enc Vitals Group     BP 09/12/21 1617 138/81     Pulse Rate 09/12/21 1617 76     Resp 09/12/21 1617 16     Temp 09/12/21 1617 98.3 F (36.8 C)     Temp Source 09/12/21 1617 Oral     SpO2 09/12/21 1617 99 %     Weight --      Height --      Head Circumference --      Peak Flow --      Pain Score 09/12/21 1615 7     Pain Loc --      Pain Edu? --      Excl. in GC? --    No data found.  Updated Vital Signs BP 138/81 (BP Location: Right Arm)   Pulse 76   Temp 98.3 F (36.8 C) (Oral)   Resp 16   LMP 09/12/2021 (Exact Date)   SpO2 99%   Visual Acuity Right Eye Distance:   Left Eye Distance:   Bilateral Distance:    Right Eye Near:   Left Eye Near:    Bilateral Near:     Physical Exam Vitals and nursing note reviewed.  Constitutional:      Appearance: Normal appearance. She is not ill-appearing.  HENT:     Head: Atraumatic.     Right Ear: Tympanic membrane normal.     Left Ear: Tympanic membrane normal.     Nose: Nose normal.     Mouth/Throat:     Mouth: Mucous membranes are moist.     Pharynx: Posterior oropharyngeal erythema present.     Comments: Moderate bilateral tonsillar erythema, edema.  No exudate appreciable on exam.  Uvula midline, oral airway patent Eyes:     Extraocular Movements: Extraocular movements intact.     Conjunctiva/sclera: Conjunctivae normal.  Cardiovascular:     Rate and Rhythm: Normal rate and regular rhythm.     Heart sounds: Normal heart sounds.  Pulmonary:     Effort: Pulmonary effort is normal.     Breath sounds: Normal breath sounds.  Musculoskeletal:        General: Normal range of motion.     Cervical back: Normal range of motion and neck supple.  Lymphadenopathy:     Cervical: Cervical adenopathy present.  Skin:    General: Skin is warm and dry.  Neurological:     Mental Status: She  is alert and oriented to person, place, and time.  Psychiatric:        Mood and Affect: Mood normal.        Thought Content: Thought content normal.        Judgment: Judgment normal.      UC Treatments / Results  Labs (all labs ordered are listed, but only abnormal results are displayed) Labs Reviewed  POCT RAPID STREP A (OFFICE)    EKG   Radiology No results found.  Procedures Procedures (including critical care time)  Medications Ordered in UC Medications - No data to display  Initial Impression / Assessment and Plan / UC Course  I have reviewed the triage vital signs and the nursing notes.  Pertinent labs & imaging results that were available during my care of the patient were reviewed by me and considered in my medical decision making (see chart for details).     Rapid strep negative but given duration, worsening course of symptoms and exam findings will cover for bacterial tonsillitis with amoxicillin, viscous lidocaine.  Over-the-counter supportive medications and home care reviewed as well.  Return for worsening symptoms.  Final Clinical Impressions(s) / UC Diagnoses   Final diagnoses:  Acute tonsillitis, unspecified etiology     Discharge Instructions      Your strep test today was negative, however given the duration of your symptoms and the severity of like to go ahead and start you on antibiotics in case this is a bacterial throat infection.  Follow-up if you are not improving    ED Prescriptions     Medication Sig Dispense Auth. Provider   amoxicillin (AMOXIL) 875 MG tablet Take 1 tablet (875 mg total) by mouth 2 (two) times daily. 20 tablet Particia Nearing, PA-C   lidocaine (XYLOCAINE) 2 % solution Use as directed 10 mLs in the mouth or throat every 3 (three) hours as needed for mouth pain. 100 mL Particia Nearing, [place]      PDMP not reviewed this encounter.   Particia Nearing, [place] 09/12/21 1948

## 2021-09-12 NOTE — Discharge Instructions (Signed)
Your strep test today was negative, however given the duration of your symptoms and the severity of like to go ahead and start you on antibiotics in case this is a bacterial throat infection.  Follow-up if you are not improving
